# Patient Record
Sex: Male | Born: 1940 | Race: Black or African American | Hispanic: No | Marital: Married | Smoking: Never smoker
Health system: Southern US, Community
[De-identification: ages and names within clinical notes are randomized; demographics above are authoritative.]

## PROBLEM LIST (undated history)

## (undated) DIAGNOSIS — I10 Essential (primary) hypertension: Secondary | ICD-10-CM

## (undated) DIAGNOSIS — E119 Type 2 diabetes mellitus without complications: Secondary | ICD-10-CM

---

## 2014-06-14 ENCOUNTER — Inpatient Hospital Stay (HOSPITAL_BASED_OUTPATIENT_CLINIC_OR_DEPARTMENT_OTHER)
Admission: EM | Admit: 2014-06-14 | Discharge: 2014-06-16 | DRG: 378 | Disposition: A | Discharge status: Home / Self Care | Payer: Medicare HMO | Attending: Internal Medicine | Admitting: Internal Medicine

## 2014-06-14 ENCOUNTER — Encounter (HOSPITAL_BASED_OUTPATIENT_CLINIC_OR_DEPARTMENT_OTHER): Payer: Self-pay

## 2014-06-14 ENCOUNTER — Other Ambulatory Visit (HOSPITAL_BASED_OUTPATIENT_CLINIC_OR_DEPARTMENT_OTHER): Payer: Self-pay

## 2014-06-14 ENCOUNTER — Inpatient Hospital Stay (HOSPITAL_COMMUNITY): Payer: Medicare HMO

## 2014-06-14 DIAGNOSIS — K922 Gastrointestinal hemorrhage, unspecified: Secondary | ICD-10-CM | POA: Diagnosis not present

## 2014-06-14 DIAGNOSIS — Z86010 Personal history of colonic polyps: Secondary | ICD-10-CM

## 2014-06-14 DIAGNOSIS — K921 Melena: Secondary | ICD-10-CM | POA: Diagnosis not present

## 2014-06-14 DIAGNOSIS — R634 Abnormal weight loss: Secondary | ICD-10-CM | POA: Diagnosis present

## 2014-06-14 DIAGNOSIS — I5040 Unspecified combined systolic (congestive) and diastolic (congestive) heart failure: Secondary | ICD-10-CM | POA: Diagnosis present

## 2014-06-14 DIAGNOSIS — I441 Atrioventricular block, second degree: Secondary | ICD-10-CM | POA: Diagnosis present

## 2014-06-14 DIAGNOSIS — K5733 Diverticulitis of large intestine without perforation or abscess with bleeding: Secondary | ICD-10-CM | POA: Diagnosis present

## 2014-06-14 DIAGNOSIS — K625 Hemorrhage of anus and rectum: Secondary | ICD-10-CM | POA: Diagnosis present

## 2014-06-14 DIAGNOSIS — Z79899 Other long term (current) drug therapy: Secondary | ICD-10-CM

## 2014-06-14 DIAGNOSIS — I10 Essential (primary) hypertension: Secondary | ICD-10-CM | POA: Diagnosis present

## 2014-06-14 DIAGNOSIS — Z791 Long term (current) use of non-steroidal anti-inflammatories (NSAID): Secondary | ICD-10-CM

## 2014-06-14 DIAGNOSIS — Z7982 Long term (current) use of aspirin: Secondary | ICD-10-CM

## 2014-06-14 DIAGNOSIS — E119 Type 2 diabetes mellitus without complications: Secondary | ICD-10-CM | POA: Diagnosis present

## 2014-06-14 HISTORY — DX: Essential (primary) hypertension: I10

## 2014-06-14 HISTORY — DX: Type 2 diabetes mellitus without complications: E11.9

## 2014-06-14 LAB — PROTIME-INR
INR: 1.06 (ref 0.00–1.49)
PROTHROMBIN TIME: 13.8 s (ref 11.6–15.2)

## 2014-06-14 LAB — COMPREHENSIVE METABOLIC PANEL
ALBUMIN: 3.3 g/dL — AB (ref 3.5–5.0)
ALK PHOS: 35 U/L — AB (ref 38–126)
ALT: 10 U/L — AB (ref 17–63)
ANION GAP: 6 (ref 5–15)
AST: 24 U/L (ref 15–41)
BILIRUBIN TOTAL: 0.2 mg/dL — AB (ref 0.3–1.2)
BUN: 16 mg/dL (ref 6–20)
CHLORIDE: 109 mmol/L (ref 101–111)
CO2: 26 mmol/L (ref 22–32)
Calcium: 8.6 mg/dL — ABNORMAL LOW (ref 8.9–10.3)
Creatinine, Ser: 1.04 mg/dL (ref 0.61–1.24)
GFR calc Af Amer: >60 mL/min (ref >60)
GFR calc non Af Amer: >60 mL/min (ref >60)
Glucose, Bld: 129 mg/dL — ABNORMAL HIGH (ref 70–99)
Potassium: 4.2 mmol/L (ref 3.5–5.1)
Sodium: 141 mmol/L (ref 135–145)
Total Protein: 7.6 g/dL (ref 6.5–8.1)

## 2014-06-14 LAB — OCCULT BLOOD X 1 CARD TO LAB, STOOL: FECAL OCCULT BLD: POSITIVE — AB

## 2014-06-14 LAB — GLUCOSE, CAPILLARY
GLUCOSE-CAPILLARY: 91 mg/dL (ref 70–99)
Glucose-Capillary: 150 mg/dL — ABNORMAL HIGH (ref 70–99)
Glucose-Capillary: 157 mg/dL — ABNORMAL HIGH (ref 70–99)

## 2014-06-14 LAB — TYPE AND SCREEN
ABO/RH(D): B POS
ANTIBODY SCREEN: NEGATIVE

## 2014-06-14 LAB — CBC
HEMATOCRIT: 32.5 % — AB (ref 39.0–52.0)
Hemoglobin: 10.5 g/dL — ABNORMAL LOW (ref 13.0–17.0)
MCH: 22.7 pg — AB (ref 26.0–34.0)
MCHC: 32.3 g/dL (ref 30.0–36.0)
MCV: 70.3 fL — AB (ref 78.0–100.0)
PLATELETS: 201 10*3/uL (ref 150–400)
RBC: 4.62 MIL/uL (ref 4.22–5.81)
RDW: 18 % — AB (ref 11.5–15.5)
WBC: 4.2 10*3/uL (ref 4.0–10.5)

## 2014-06-14 LAB — MAGNESIUM: Magnesium: 1.9 mg/dL (ref 1.7–2.4)

## 2014-06-14 LAB — HEMOGLOBIN AND HEMATOCRIT, BLOOD
HCT: 32.8 % — ABNORMAL LOW (ref 39.0–52.0)
HCT: 33.7 % — ABNORMAL LOW (ref 39.0–52.0)
Hemoglobin: 10.4 g/dL — ABNORMAL LOW (ref 13.0–17.0)
Hemoglobin: 10.4 g/dL — ABNORMAL LOW (ref 13.0–17.0)

## 2014-06-14 LAB — TROPONIN I
TROPONIN I: 0.05 ng/mL — AB (ref <0.031)
Troponin I: 0.06 ng/mL — ABNORMAL HIGH (ref <0.031)

## 2014-06-14 LAB — APTT: aPTT: 26 seconds (ref 24–37)

## 2014-06-14 LAB — ABO/RH: ABO/RH(D): B POS

## 2014-06-14 MED ORDER — ONDANSETRON HCL 4 MG PO TABS: 4.0000 mg | ORAL_TABLET | Freq: Four times a day (QID) | ORAL | Status: DC | PRN

## 2014-06-14 MED ORDER — ONDANSETRON HCL 4 MG/2ML IJ SOLN: 4.0000 mg | Freq: Four times a day (QID) | INTRAMUSCULAR | Status: DC | PRN

## 2014-06-14 MED ORDER — POLYETHYLENE GLYCOL 3350 17 G PO PACK
17.0000 g | PACK | Freq: Three times a day (TID) | ORAL | Status: DC
  Administered 2014-06-14 – 2014-06-15 (×5): 17 g via ORAL
  Filled 2014-06-14 (×5): qty 1

## 2014-06-14 MED ORDER — PANTOPRAZOLE SODIUM 40 MG IV SOLR
40.0000 mg | Freq: Two times a day (BID) | INTRAVENOUS | Status: DC
  Administered 2014-06-14 – 2014-06-15 (×4): 40 mg via INTRAVENOUS
  Filled 2014-06-14 (×4): qty 40

## 2014-06-14 MED ORDER — SODIUM CHLORIDE 0.9 % IJ SOLN
3.0000 mL | Freq: Two times a day (BID) | INTRAMUSCULAR | Status: DC
  Administered 2014-06-15: 3 mL via INTRAVENOUS

## 2014-06-14 MED ORDER — GUAIFENESIN-DM 100-10 MG/5ML PO SYRP: 5.0000 mL | ORAL_SOLUTION | ORAL | Status: DC | PRN

## 2014-06-14 MED ORDER — CETYLPYRIDINIUM CHLORIDE 0.05 % MT LIQD
7.0000 mL | Freq: Two times a day (BID) | OROMUCOSAL | Status: DC
  Administered 2014-06-14 – 2014-06-15 (×4): 7 mL via OROMUCOSAL

## 2014-06-14 MED ORDER — SODIUM CHLORIDE 0.9 % IV SOLN
INTRAVENOUS | Status: AC
  Administered 2014-06-14: 11:00:00 via INTRAVENOUS

## 2014-06-14 MED ORDER — HYDRALAZINE HCL 20 MG/ML IJ SOLN: 10.0000 mg | Freq: Four times a day (QID) | INTRAMUSCULAR | Status: DC | PRN

## 2014-06-14 MED ORDER — IOHEXOL 300 MG/ML  SOLN
100.0000 mL | Freq: Once | INTRAMUSCULAR | Status: AC | PRN
  Administered 2014-06-14: 100 mL via INTRAVENOUS

## 2014-06-14 MED ORDER — IOHEXOL 300 MG/ML  SOLN
25.0000 mL | INTRAMUSCULAR | Status: AC
  Administered 2014-06-14 (×2): 25 mL via ORAL

## 2014-06-14 MED ORDER — HYDROCODONE-ACETAMINOPHEN 5-325 MG PO TABS: 1.0000 | ORAL_TABLET | ORAL | Status: DC | PRN

## 2014-06-14 MED ORDER — INSULIN ASPART 100 UNIT/ML ~~LOC~~ SOLN
0.0000 [IU] | SUBCUTANEOUS | Status: DC
  Administered 2014-06-14: 1 [IU] via SUBCUTANEOUS
  Administered 2014-06-14: 2 [IU] via SUBCUTANEOUS
  Administered 2014-06-15 – 2014-06-16 (×3): 1 [IU] via SUBCUTANEOUS

## 2014-06-14 MED ORDER — METOPROLOL TARTRATE 50 MG PO TABS
50.0000 mg | ORAL_TABLET | Freq: Two times a day (BID) | ORAL | Status: DC
  Administered 2014-06-14: 50 mg via ORAL
  Filled 2014-06-14: qty 1

## 2014-06-14 NOTE — H&P (Signed)
Patient Demographics  Joel MastJoseph Higgins, is a 74 y.o. male  MRN: [id number]   DOB - [birth year]  Admit Date - 06/14/2014  Outpatient Primary MD for the patient is No primary care provider on file.   With History of -  Past Medical History  Diagnosis Date  . Hypertension   . Diabetes mellitus without complication       History reviewed. No pertinent past surgical history.  in for   Chief Complaint  Patient presents with  . Rectal Bleeding     HPI  Joel Higgins  is a 74 y.o. male, who lives in [place] and is visiting family locally with history of essential hypertension, type 2 diabetes myelitis, diverticulosis diagnosed few years ago and colonoscopy, colonic polyp removed 5 years ago in [place]. Who was in good state of health however since yesterday evening has been having bright red blood per stool, this has happened about 5-6 times, he denies any abdominal pain or discomfort, no cramping, no nausea vomiting. He does have unintentional 8 pound weight loss in the last 2 months.  He presented to [facility] where he was diagnosed with lower GI bleed, Dr. Randa EvensEdwards [facility] was consulted who requested me to admit the patient. Patient currently is completely symptom free except for blood in stool. Denies any headache, no chest pain palpitations cough and shortness of breath. No abdominal pain or cramping. No focal weakness. No blood in urine no easy bruising.   Review of Systems    In addition to the HPI above,   No Fever-chills, No Headache, No changes with Vision or hearing, No problems swallowing food or Liquids, No Chest pain, Cough or Shortness of Breath, No Abdominal pain, No Nausea or Vommitting, Bowel movements are bloody and lose No Blood in Urine, No dysuria, No new skin rashes or bruises, No new  joints pains-aches,  No new weakness, tingling, numbness in any extremity, No recent weight gain or loss, No polyuria, polydypsia or polyphagia, No significant Mental Stressors.  A full 10 point Review of Systems was done, except as stated above, all other Review of Systems were negative.   Social History History  Substance Use Topics  . Smoking status: Never Smoker   . Smokeless tobacco: Not on file  . Alcohol Use: No    Lives - [place] with wife  Mobility - ablates by self, dries is active     Family History No family history of colon cancer  Prior to Admission medications   Medication Sig Start Date End Date Taking? Authorizing Provider  aspirin 81 MG tablet Take 81 mg by mouth daily.   Yes Historical Provider, MD  ibuprofen (ADVIL,MOTRIN) 200 MG tablet Take 800 mg by mouth every 4 (four) hours as needed for headache or moderate pain.   Yes Historical Provider, MD  metFORMIN (GLUCOPHAGE) 500 MG tablet Take 2 tablets by mouth 2 (two) times daily. 05/29/14  Yes  Historical Provider, MD  metoprolol succinate (TOPROL-XL) 25 MG 24 hr tablet Take 25 mg by mouth daily. 05/29/14  Yes Historical Provider, MD  OVER THE COUNTER MEDICATION Apply 1 application topically daily. Dry Skin (diabetic lotion)   Yes Historical Provider, MD  ramipril (ALTACE) 10 MG capsule Take 1 capsule by mouth daily. 06/07/14  Yes Historical Provider, MD  Ranitidine HCl (ZANTAC 75 PO) Take by mouth.   Yes Historical Provider, MD  terazosin (HYTRIN) 1 MG capsule Take 1 capsule by mouth at bedtime. 05/29/14  Yes Historical Provider, MD    No Known Allergies  Physical Exam  Vitals  Blood pressure 164/88, pulse 84, temperature 98.3 F (36.8 C), temperature source Oral, resp. rate 16, height 6' (1.829 m), weight 90.719 kg (200 lb), SpO2 99 %.   1. General elderly African-American male lying in bed in NAD,    2. Normal affect and insight, Not Suicidal or Homicidal, Awake Alert, Oriented X 3.  3. No F.N  deficits, ALL C.Nerves Intact, Strength 5/5 all 4 extremities, Sensation intact all 4 extremities, Plantars down going.  4. Ears and Eyes appear Normal, Conjunctivae clear, PERRLA. Moist Oral Mucosa.  5. Supple Neck, No JVD, No cervical lymphadenopathy appriciated, No Carotid Bruits.  6. Symmetrical Chest wall movement, Good air movement bilaterally, CTAB.  7. RRR, No Gallops, Rubs or Murmurs, No Parasternal Heave.  8. Positive Bowel Sounds, Abdomen Soft, No tenderness, No organomegaly appriciated,No rebound -guarding or rigidity.  9.  No Cyanosis, Normal Skin Turgor, No Skin Rash or Bruise.  10. Good muscle tone,  joints appear normal , no effusions, Normal ROM.  11. No Palpable Lymph Nodes in Neck or Axillae     Data Review  CBC  Recent Labs Lab 06/14/14 0940  WBC 4.2  HGB 10.5*  HCT 32.5*  PLT 201  MCV 70.3*  MCH 22.7*  MCHC 32.3  RDW 18.0*   ------------------------------------------------------------------------------------------------------------------  Chemistries   Recent Labs Lab 06/14/14 0940  NA 141  K 4.2  CL 109  CO2 26  GLUCOSE 129*  BUN 16  CREATININE 1.04  CALCIUM 8.6*  MG 1.9  AST 24  ALT 10*  ALKPHOS 35*  BILITOT 0.2*   ------------------------------------------------------------------------------------------------------------------ estimated creatinine clearance is 68.4 mL/min (by C-G formula based on Cr of 1.04). ------------------------------------------------------------------------------------------------------------------ No results for input(s): TSH, T4TOTAL, T3FREE, THYROIDAB in the last 72 hours.  Invalid input(s): FREET3   Coagulation profile  Recent Labs Lab 06/14/14 0940  INR 1.06   ------------------------------------------------------------------------------------------------------------------- No results for input(s): DDIMER in the last 72  hours. -------------------------------------------------------------------------------------------------------------------  Cardiac Enzymes No results for input(s): CKMB, TROPONINI, MYOGLOBIN in the last 168 hours.  Invalid input(s): CK ------------------------------------------------------------------------------------------------------------------ Invalid input(s): POCBNP   ---------------------------------------------------------------------------------------------------------------  Urinalysis No results found for: COLORURINE, APPEARANCEUR, LABSPEC, PHURINE, GLUCOSEU, HGBUR, BILIRUBINUR, KETONESUR, PROTEINUR, UROBILINOGEN, NITRITE, LEUKOCYTESUR  ----------------------------------------------------------------------------------------------------------------  Imaging results:   No results found.  My personal review of EKG: Rhythm NSR,non specific ST changes    Assessment & Plan   1. Lower GI bleed. Suspicious for diverticular bleed, liver malignancy at this stage cannot be ruled out especially with 8 pound unintentional weight loss. Will be admitted to a telemetry bed, ice cream, IV PPI, clear liquids, monitor H&H. Goal to keep hemoglobin above 8. IV fluids. GI Dr. Randa Evens has been consulted. Will obtain CT abdomen pelvis for now.   2. Essential hypertension. For now only Lopressor and as needed hydralazine and monitor.    3. Nonspecific ST changes on EKG. No chest symptoms,  check troponin 2. Continue beta blocker for now. Keep hemoglobin above 8. No CAD history of CHF history.    4. DM type II. Check A1c, every 4 sliding scale. Hold Glucophage.     DVT Prophylaxis   SCDs   AM Labs Ordered, also please review Full Orders  Family Communication: Admission, patients condition and plan of care including tests being ordered have been discussed with the patient and wife who indicate understanding and agree with the plan and Code Status.  Code Status Full  Likely DC  to  Home  Condition Fair   Time spent in minutes : 35    Lyon Dumont K M.D on 06/14/2014 at 2:22 PM  Between 7am to 7pm - Pager - [phone]  After 7pm go to [email or web address] - password [email or web address]  [facility]  Office  [phone]

## 2014-06-14 NOTE — Progress Notes (Signed)
Patient arrived to unit at around 1340,came via non emergency ambulance. Alert and orientedx3. Denies pain.Oriented to environment. Placed comfortably in bed. Will continue to monitor.

## 2014-06-14 NOTE — ED Provider Notes (Signed)
TIME SEEN: 10:45 AM  CHIEF COMPLAINT: GI bleed  HPI: Pt is a 74 y.o. male with history of hypertension, diabetes, diverticulosis who presents to the emergency department with rectal bleeding that started yesterday. States 2 weeks ago he did have one episode of bright red blood per rectum but this resolved. States yesterday he started having blood in his stools and has noticed a large amount of blood being passed with bowel movements. Has not noticed any clots. Has had over 5 episodes since yesterday evening. Denies melena. No abdominal pain. No vomiting. Not on anticoagulation. Denies prior history of GI bleed. Last colonoscopy was approximate 5 years ago. States he was told he has diverticulosis and polyps. Reports he is feeling lightheaded, fatigued, weak. He is visiting his family from [place] where his PCP Dr. Moses MannersVincent Ober with [facility] is located.  His gastroenterologist is also in [place]. He does not plan to go back to [place] until May 16.  ROS: See HPI Constitutional: no fever  Eyes: no drainage  ENT: no runny nose   Cardiovascular:  no chest pain  Resp: no SOB  GI: no vomiting GU: no dysuria Integumentary: no rash  Allergy: no hives  Musculoskeletal: no leg swelling  Neurological: no slurred speech ROS otherwise negative  PAST MEDICAL HISTORY/PAST SURGICAL HISTORY:  Past Medical History  Diagnosis Date  . Hypertension   . Diabetes mellitus without complication     MEDICATIONS:  Prior to Admission medications   Medication Sig Start Date End Date Taking? Authorizing Provider  aspirin 81 MG tablet Take 81 mg by mouth daily.   Yes Historical Provider, MD  METFORMIN HCL ER, MOD, PO Take by mouth.   Yes Historical Provider, MD  METOPROLOL SUCCINATE ER PO Take by mouth.   Yes Historical Provider, MD  RAMIPRIL PO Take by mouth.   Yes Historical Provider, MD  Ranitidine HCl (ZANTAC 75 PO) Take by mouth.   Yes Historical Provider, MD  TERAZOSIN HCL PO Take by  mouth.   Yes Historical Provider, MD    ALLERGIES:  No Known Allergies  SOCIAL HISTORY:  History  Substance Use Topics  . Smoking status: Never Smoker   . Smokeless tobacco: Not on file  . Alcohol Use: No    FAMILY HISTORY: History reviewed. No pertinent family history.  EXAM: BP 149/111 mmHg  Pulse 28  Temp(Src) 98.2 F (36.8 C) (Oral)  Resp 18  Ht 6' (1.829 m)  Wt 200 lb (90.719 kg)  BMI 27.12 kg/m2  SpO2 98% CONSTITUTIONAL: Alert and oriented and responds appropriately to questions. Well-appearing; well-nourished HEAD: Normocephalic EYES: Conjunctivae clear, PERRL ENT: normal nose; no rhinorrhea; moist mucous membranes; pharynx without lesions noted NECK: Supple, no meningismus, no LAD  CARD: RRR; S1 and S2 appreciated; no murmurs, no clicks, no rubs, no gallops RESP: Normal chest excursion without splinting or tachypnea; breath sounds clear and equal bilaterally; no wheezes, no rhonchi, no rales, no hypoxia or respiratory distress, speaking full sentences ABD/GI: Normal bowel sounds; non-distended; soft, non-tender, no rebound, no guarding, no peritoneal signs RECTAL:  Large amount of bright red blood in the rectal vault, no melena, normal rectal tone, no hemorrhoids, nontender rectal exam BACK:  The back appears normal and is non-tender to palpation, there is no CVA tenderness EXT: Normal ROM in all joints; non-tender to palpation; no edema; normal capillary refill; no cyanosis, no calf tenderness or swelling    SKIN: Normal color for age and race; warm NEURO: Moves all extremities  equally, sensation to light touch intact diffusely, cranial nerves II through XII intact PSYCH: The patient's mood and manner are appropriate. Grooming and personal hygiene are appropriate.  MEDICAL DECISION MAKING: Patient here with rectal bleed, likely diverticular in nature. He is hemodynamically stable. Intermittently bradycardic but this quickly resolves. EKG shows first-degree AV block  which he thinks is chronic. No chest pain or shortness of breath. He does have a large amount of blood in his rectal vault today. Hemoglobin is 10.5 with no old for comparison. Will obtain to obtain outside records. Given his age and the large amount of blood seen on rectal exam without obvious sign of hemorrhoids I feel he will need admission. We'll discuss with gastroenterology on-call. [facility] would [facility].  ED PROGRESS: 11:30 AM  Spoke with [facility]anda [facility]wards [facility]. He agrees to see the patient in consult. Will discuss with hospitalist.   11:45 AM  D/w Dr. Gonzella Lexhungel for [facility].   EKG Interpretation  Date/Time:  Friday Jun 14 2014 11:04:52 EDT Ventricular Rate:  65 PR Interval:    QRS Duration: 98 QT Interval:  406 QTC Calculation: 422 R Axis:   26 Text Interpretation:  Normal sinus rhythm First degree A-V block Premature ventricular complexes Nonspecific T wave abnormality Abnormal ECG Confirmed by WARD,  DO, KRISTEN ([id number]) on 06/14/2014 11:16:10 AM          Layla MawKristen N Ward, DO 06/14/14 1712

## 2014-06-14 NOTE — ED Notes (Signed)
Pt reports one week of bloody stools - states yesterday the bright red blood in his stools became worse and greater in the amount. Pt denies feeling weak, dizzy, tired. Denies pain, reports weight loss of a few lbs over the past few months.

## 2014-06-14 NOTE — Progress Notes (Addendum)
Pt converted to 2nd degree AV block, RN did EKG and confirmed the Rhythm, RN informed oncall N.P. Ordered to call him on the 3rd tropinin lvl result. Pt is asymptomatic, no complain of chest pain, Will continue to monitor.

## 2014-06-14 NOTE — Consult Note (Signed)
[facility] CON[facility] Reason for consult: G.I. bleeding Referring Physician: [facility].  Joel Higgins is an 74 y.o. male.  HPI: he is visiting here from [place]. He tells me that he has had previous colon polyps and had colonoscopy about 5 years ago. He reports that he is due later this year for a surveillance colonoscopy. He also tells me that he had significant diverticulosis on previous colonoscopies. He has been doing well without constipation or any significant abdominal pain. Yesterday evening he began to have painless cinematic [place] and has had 5 to 6 bloody bowel movements. He has had some weight loss over the past several months but has been staying here in [place] and has been eating differently. He is not had any chronic abdominal pain. He does take ibuprofen but no other anticoagulants.  Past Medical History  Diagnosis Date  . Hypertension   . Diabetes mellitus without complication     History reviewed. No pertinent past surgical history.  History reviewed. No pertinent family history.  Social History:  reports that he has never smoked. He does not have any smokeless tobacco history on file. He reports that he does not drink alcohol. His drug history is not on file.  Allergies: No Known Allergies  Medications; Prior to Admission medications   Medication Sig Start Date End Date Taking? Authorizing Provider  aspirin 81 MG tablet Take 81 mg by mouth daily.   Yes Historical Provider, MD  ibuprofen (ADVIL,MOTRIN) 200 MG tablet Take 800 mg by mouth every 4 (four) hours as needed for headache or moderate pain.   Yes Historical Provider, MD  metFORMIN (GLUCOPHAGE) 500 MG tablet Take 2 tablets by mouth 2 (two) times daily. 05/29/14  Yes Historical Provider, MD  metoprolol succinate (TOPROL-XL) 25 MG 24 hr tablet Take 25 mg by mouth daily. 05/29/14  Yes Historical Provider, MD  OVER THE COUNTER MEDICATION Apply 1 application topically daily. Dry Skin (diabetic  lotion)   Yes Historical Provider, MD  ramipril (ALTACE) 10 MG capsule Take 1 capsule by mouth daily. 06/07/14  Yes Historical Provider, MD  Ranitidine HCl (ZANTAC 75 PO) Take by mouth.   Yes Historical Provider, MD  terazosin (HYTRIN) 1 MG capsule Take 1 capsule by mouth at bedtime. 05/29/14  Yes Historical Provider, MD   . antiseptic oral rinse  7 mL Mouth Rinse BID  . insulin aspart  0-9 Units Subcutaneous Q4H  . metoprolol tartrate  50 mg Oral BID  . pantoprazole (PROTONIX) IV  40 mg Intravenous Q12H  . sodium chloride  3 mL Intravenous Q12H   PRN Meds guaiFENesin-dextromethorphan, hydrALAZINE, HYDROcodone-acetaminophen, ondansetron **OR** ondansetron (ZOFRAN) IV Results for orders placed or performed during the hospital encounter of 06/14/14 (from the past 48 hour(s))  Occult blood card to lab, stool     Status: Abnormal   Collection Time: 06/14/14  9:29 AM  Result Value Ref Range   Fecal Occult Bld POSITIVE (A) NEGATIVE  CBC     Status: Abnormal   Collection Time: 06/14/14  9:40 AM  Result Value Ref Range   WBC 4.2 4.0 - 10.5 K/uL   RBC 4.62 4.22 - 5.81 MIL/uL   Hemoglobin 10.5 (L) 13.0 - 17.0 g/dL   HCT 32.5 (L) 39.0 - 52.0 %   MCV 70.3 (L) 78.0 - 100.0 fL   MCH 22.7 (L) 26.0 - 34.0 pg   MCHC 32.3 30.0 - 36.0 g/dL   RDW 18.0 (H) 11.5 - 15.5 %   Platelets 201 150 -  400 K/uL    Comment: SPECIMEN CHECKED FOR CLOTS PLATELET COUNT CONFIRMED BY SMEAR   Comprehensive metabolic panel     Status: Abnormal   Collection Time: 06/14/14  9:40 AM  Result Value Ref Range   Sodium 141 135 - 145 mmol/L   Potassium 4.2 3.5 - 5.1 mmol/L   Chloride 109 101 - 111 mmol/L   CO2 26 22 - 32 mmol/L   Glucose, Bld 129 (H) 70 - 99 mg/dL   BUN 16 6 - 20 mg/dL   Creatinine, Ser 1.04 0.61 - 1.24 mg/dL   Calcium 8.6 (L) 8.9 - 10.3 mg/dL   Total Protein 7.6 6.5 - 8.1 g/dL   Albumin 3.3 (L) 3.5 - 5.0 g/dL   AST 24 15 - 41 U/L   ALT 10 (L) 17 - 63 U/L   Alkaline Phosphatase 35 (L) 38 - 126 U/L    Total Bilirubin 0.2 (L) 0.3 - 1.2 mg/dL   GFR calc non Af Amer >60 >60 mL/min   GFR calc Af Amer >60 >60 mL/min    Comment: (NOTE) The eGFR has been calculated using the CKD EPI equation. This calculation has not been validated in all clinical situations. eGFR's persistently <60 mL/min signify possible Chronic Kidney Disease.    Anion gap 6 5 - 15  APTT     Status: None   Collection Time: 06/14/14  9:40 AM  Result Value Ref Range   aPTT 26 24 - 37 seconds  Protime-INR     Status: None   Collection Time: 06/14/14  9:40 AM  Result Value Ref Range   Prothrombin Time 13.8 11.6 - 15.2 seconds   INR 1.06 0.00 - 1.49  Magnesium     Status: None   Collection Time: 06/14/14  9:40 AM  Result Value Ref Range   Magnesium 1.9 1.7 - 2.4 mg/dL  Glucose, capillary     Status: None   Collection Time: 06/14/14  2:46 PM  Result Value Ref Range   Glucose-Capillary 91 70 - 99 mg/dL    No results found. ROS: Constitutional: some weight loss but reports good appetite HEENT: no headaches Cardiovascular: no chest pain or history of heart disease Respiratory: no significant lung disease or shortness of breath GI: as above GU: no history of renal stones Musculoskeletal: negative other than occasional back pain Neuro/Psychiatric: okay Endocrine/Heme: okay            Blood pressure 181/59, pulse 64, temperature 98.5 F (36.9 C), temperature source Oral, resp. rate 17, height 6' (1.829 m), weight 90.719 kg (200 lb), SpO2 100 %.  Physical exam:   General-- pleasant African-American male in no acute distress ENT-- throat normal sclera nonenteric Neck-- no lymphadenopathy Heart-- regular rate and rhythm without murmurs or gallops Lungs-- clear Abdomen-- soft and nontender Psych-- alert and oriented   Assessment: 1. Painless hematochezia. Almost certainly due to diverticular bleed  Plan: will continue on liquid diet led Miralax to try to clean out. Hopefully he will stop  bleeding. We will follow with you.   Ronte Parker JR,Zacari Stiff L 06/14/2014, 3:12 PM   Pager: [phone] If no answer or after hours call [phone]

## 2014-06-15 ENCOUNTER — Inpatient Hospital Stay (HOSPITAL_COMMUNITY): Payer: Medicare HMO

## 2014-06-15 DIAGNOSIS — I441 Atrioventricular block, second degree: Secondary | ICD-10-CM

## 2014-06-15 LAB — HEMOGLOBIN A1C
Hgb A1c MFr Bld: 6.6 % — ABNORMAL HIGH (ref 4.8–5.6)
MEAN PLASMA GLUCOSE: 143 mg/dL

## 2014-06-15 LAB — CBC
HCT: 31.6 % — ABNORMAL LOW (ref 39.0–52.0)
Hemoglobin: 10 g/dL — ABNORMAL LOW (ref 13.0–17.0)
MCH: 22.7 pg — ABNORMAL LOW (ref 26.0–34.0)
MCHC: 31.6 g/dL (ref 30.0–36.0)
MCV: 71.7 fL — ABNORMAL LOW (ref 78.0–100.0)
PLATELETS: 183 10*3/uL (ref 150–400)
RBC: 4.41 MIL/uL (ref 4.22–5.81)
RDW: 17.6 % — ABNORMAL HIGH (ref 11.5–15.5)
WBC: 4.6 10*3/uL (ref 4.0–10.5)

## 2014-06-15 LAB — GLUCOSE, CAPILLARY
GLUCOSE-CAPILLARY: 105 mg/dL — AB (ref 70–99)
GLUCOSE-CAPILLARY: 121 mg/dL — AB (ref 70–99)
Glucose-Capillary: 105 mg/dL — ABNORMAL HIGH (ref 70–99)
Glucose-Capillary: 100 mg/dL — ABNORMAL HIGH (ref 70–99)
Glucose-Capillary: 114 mg/dL — ABNORMAL HIGH (ref 70–99)
Glucose-Capillary: 131 mg/dL — ABNORMAL HIGH (ref 70–99)

## 2014-06-15 LAB — BASIC METABOLIC PANEL
Anion gap: 4 — ABNORMAL LOW (ref 5–15)
BUN: 12 mg/dL (ref 6–20)
CO2: 26 mmol/L (ref 22–32)
Calcium: 8.5 mg/dL — ABNORMAL LOW (ref 8.9–10.3)
Chloride: 107 mmol/L (ref 101–111)
Creatinine, Ser: 0.9 mg/dL (ref 0.61–1.24)
GFR calc Af Amer: >60 mL/min (ref >60)
Glucose, Bld: 107 mg/dL — ABNORMAL HIGH (ref 70–99)
Potassium: 3.7 mmol/L (ref 3.5–5.1)
Sodium: 137 mmol/L (ref 135–145)

## 2014-06-15 LAB — TROPONIN I: Troponin I: 0.05 ng/mL — ABNORMAL HIGH (ref <0.031)

## 2014-06-15 MED ORDER — AMLODIPINE BESYLATE 10 MG PO TABS
10.0000 mg | ORAL_TABLET | Freq: Every day | ORAL | Status: DC
  Administered 2014-06-15 – 2014-06-16 (×2): 10 mg via ORAL
  Filled 2014-06-15 (×2): qty 1

## 2014-06-15 MED ORDER — SODIUM CHLORIDE 0.9 % IV SOLN
3.0000 g | Freq: Four times a day (QID) | INTRAVENOUS | Status: DC
  Administered 2014-06-15 – 2014-06-16 (×5): 3 g via INTRAVENOUS
  Filled 2014-06-15 (×7): qty 3

## 2014-06-15 MED ORDER — METOPROLOL TARTRATE 25 MG PO TABS: 25.0000 mg | ORAL_TABLET | Freq: Two times a day (BID) | ORAL | Status: DC

## 2014-06-15 NOTE — Progress Notes (Signed)
  Echocardiogram 2D Echocardiogram has been performed.  Aris EvertsRix, Maymie Brunke A 06/15/2014, 11:58 AM

## 2014-06-15 NOTE — Progress Notes (Signed)
Patient Demographics  Joel Higgins, is a 74 y.o. male, DOB - 1940/11/28, XBJ:[id number]  Admit date - 06/14/2014   Admitting Physician No admitting provider for patient encounter.  Outpatient Primary MD for the patient is No primary care provider on file.  LOS - 1   Chief Complaint  Patient presents with  . Rectal Bleeding        Subjective:   Joel MastJoseph Higgins today has, No headache, No chest pain, No abdominal pain - No Nausea, No new weakness tingling or numbness, No Cough - SOB. No blood in stool  Assessment & Plan    1. Lower GI bleed. Suspicious for diverticular bleed, GI malignancy at this age cannot be ruled out especially with 8 pound unintentional weight loss. Stable, no significant drop in H&H, no further GI bleed BM this morning clear, continue with bowel rest, question of mild diverticulitis on CT scan, will add IV Unasyn continue IV PPI, clear liquids, monitor H&H. Goal to keep hemoglobin above 8. IV fluids. GI Dr. Randa EvensEdwards on board. Likely colonoscopy in a few weeks after [place]. H&H stable will likely discharge tomorrow evening.   2. Essential hypertension. Stopped Lopressor due to Mobitz type I heart block, laced on Norvasc and as needed hydralazine and monitor.    3. Nonspecific ST changes on EKG. No chest symptoms, mild troponin rise is not in ACS pattern, he is chest pain-free, EKG is nonacute, cannot give aspirin due to GI bleed cannot give beta blocker due to heart block, EKG reviewed with cardiologist Dr. Simona HuhSam McDowell. Check echogram.    4. DM type II.   every 4 sliding scale. Hold Glucophage.  Lab Results  Component Value Date   HGBA1C 6.6* 06/14/2014    CBG (last 3)   Recent Labs  06/15/14 0149 06/15/14 0530 06/15/14 0803  GLUCAP 105* 100* 114*      5. Mobitz type I heart block. Symptom-free, blood pressure stable, discontinue beta blocker and monitor. Echogram ordered.     Code Status: Full  Family Communication: Wife bedside on the day of admission  Disposition Plan: Home if stable on 06/16/2014 evening   Procedures   CT scan abdomen and pelvis showing mild diverticulitis and nonspecific findings.  Echogram ordered   Consults  GI - edwards, cardiologist Dr. Simona HuhSam McDowell consulted informally for EKG and rhythm review   Medications  Scheduled Meds: . amLODipine  10 mg Oral Daily  . ampicillin-sulbactam (UNASYN) IV  3 g Intravenous 4 times per day  . antiseptic oral rinse  7 mL Mouth Rinse BID  . insulin aspart  0-9 Units Subcutaneous Q4H  . pantoprazole (PROTONIX) IV  40 mg Intravenous Q12H  . polyethylene glycol  17 g Oral TID  . sodium chloride  3 mL Intravenous Q12H   Continuous Infusions:  PRN Meds:.guaiFENesin-dextromethorphan, hydrALAZINE, HYDROcodone-acetaminophen, ondansetron **OR** ondansetron (ZOFRAN) IV  DVT Prophylaxis   SCDs   Lab Results  Component Value Date   PLT 183 06/15/2014    Antibiotics     Anti-infectives    Start     Dose/Rate Route Frequency Ordered Stop   06/15/14 0800  Ampicillin-Sulbactam (UNASYN) 3 g in sodium chloride 0.9 % 100 mL IVPB     3 g 100  mL/hr over 60 Minutes Intravenous 4 times per day 06/15/14 0728            Objective:   Filed Vitals:   06/14/14 1422 06/14/14 1441 06/14/14 2055 06/15/14 0532  BP: 181/59 181/59 166/73 150/76  Pulse: 57 64 53 67  Temp: 98.5 F (36.9 C)  99.1 F (37.3 C) 98.8 F (37.1 C)  TempSrc: Oral  Oral Oral  Resp: 17  18 18   Height:      Weight:    90.9 kg (200 lb 6.4 oz)  SpO2: 100%  100% 100%    Wt Readings from Last 3 Encounters:  06/15/14 90.9 kg (200 lb 6.4 oz)     Intake/Output Summary (Last 24 hours) at 06/15/14 1001 Last data filed at 06/15/14 0600  Gross per 24 hour  Intake    600 ml  Output      0 ml   Net    600 ml     Physical Exam  Awake Alert, Oriented X 3, No new F.N deficits, Normal affect [place].AT,PERRAL Supple Neck,No JVD, No cervical lymphadenopathy appriciated.  Symmetrical Chest wall movement, Good air movement bilaterally, CTAB RRR,No Gallops,Rubs or new Murmurs, No Parasternal Heave +ve B.Sounds, Abd Soft, No tenderness, No organomegaly appriciated, No rebound - guarding or rigidity. No Cyanosis, Clubbing or edema, No new Rash or bruise     Data Review   Micro Results No results found for this or any previous visit (from the past 240 hour(s)).  Radiology Reports Ct Abdomen Pelvis W Contrast  06/14/2014   CLINICAL DATA:  Bright red blood in stools for the past day. The pound weight loss over the past 2 months, unintentional. Diabetes.  EXAM: CT ABDOMEN AND PELVIS WITH CONTRAST  TECHNIQUE: Multidetector CT imaging of the abdomen and pelvis was performed using the standard protocol following bolus administration of intravenous contrast.  CONTRAST:  100mL OMNIPAQUE IOHEXOL 300 MG/ML  SOLN  COMPARISON:  None.  FINDINGS: Lower chest: Cylindrical bronchiectasis in both lower lobes with mild airway thickening. Mild cardiomegaly. Standard timing for abdomen imaging resulted an arterial phase imaging, indicating reduced cardiac output.  Hepatobiliary: Unremarkable. Mildly reduced sensitivity due to the early phase of contrast.  Pancreas: Unremarkable  Spleen: Unremarkable  Adrenals/Urinary Tract: Tiny hypodense lesion of the right kidney upper pole is technically nonspecific although statistically likely to be a cyst or similar benign lesion.  Stomach/Bowel: Prominent stool throughout the colon favors constipation. Considerable diverticula in the transverse, descending, and sigmoid colon compatible with extensive colonic diverticulosis. There is mild active diverticulitis in the distal sigmoid colon with stranding in the adjacent mesentery shown on images 47 through 38 of series 602.  Appendix normal.  Vascular/Lymphatic: Aortoiliac atherosclerotic vascular disease. There are scattered left inguinal lymph nodes but these are not pathologically enlarged by size criteria.  Reproductive: Enlarged prostate gland, 6.7 cm transverse by 4.2 cm anterior -posterior.  Other: No supplemental non-categorized findings.  Musculoskeletal: Lumbar spondylosis and degenerative disc disease causing bilateral foraminal impingement at L4-5 and L5-S1 and right foraminal impingement at L3-4.  IMPRESSION: 1. Mild acute diverticulitis in the distal sigmoid colon. Extensive diverticulosis involving the transverse, descending, and sigmoid colon. 2. Please note that today's exam is not a substitute for colon cancer screening. 3. Cylindrical bronchiectasis in both lower lobes with mild airway thickening, chronic in appearance. 4. Mild cardiomegaly. Delayed contrast bolus implies reduced cardiac output. 5.  Prominent stool throughout the colon favors constipation. 6.  Aortoiliac atherosclerotic vascular disease. 7. Enlarged prostate  gland. 8. Lumbar spondylosis and degenerative disc disease, causing multilevel foraminal impingement.   Electronically Signed   By: Gaylyn Rong M.D.   On: 06/14/2014 18:00     CBC  Recent Labs Lab 06/14/14 0940 06/14/14 1451 06/14/14 1948 06/15/14 0215  WBC 4.2  --   --  4.6  HGB 10.5* 10.4* 10.4* 10.0*  HCT 32.5* 32.8* 33.7* 31.6*  PLT 201  --   --  183  MCV 70.3*  --   --  71.7*  MCH 22.7*  --   --  22.7*  MCHC 32.3  --   --  31.6  RDW 18.0*  --   --  17.6*    Chemistries   Recent Labs Lab 06/14/14 0940 06/15/14 0215  NA 141 137  K 4.2 3.7  CL 109 107  CO2 26 26  GLUCOSE 129* 107*  BUN 16 12  CREATININE 1.04 0.90  CALCIUM 8.6* 8.5*  MG 1.9  --   AST 24  --   ALT 10*  --   ALKPHOS 35*  --   BILITOT 0.2*  --    ------------------------------------------------------------------------------------------------------------------ estimated creatinine  clearance is 79 mL/min (by C-G formula based on Cr of 0.9). ------------------------------------------------------------------------------------------------------------------  Recent Labs  06/14/14 1451  HGBA1C 6.6*   ------------------------------------------------------------------------------------------------------------------ No results for input(s): CHOL, HDL, LDLCALC, TRIG, CHOLHDL, LDLDIRECT in the last 72 hours. ------------------------------------------------------------------------------------------------------------------ No results for input(s): TSH, T4TOTAL, T3FREE, THYROIDAB in the last 72 hours.  Invalid input(s): FREET3 ------------------------------------------------------------------------------------------------------------------ No results for input(s): VITAMINB12, FOLATE, FERRITIN, TIBC, IRON, RETICCTPCT in the last 72 hours.  Coagulation profile  Recent Labs Lab 06/14/14 0940  INR 1.06    No results for input(s): DDIMER in the last 72 hours.  Cardiac Enzymes  Recent Labs Lab 06/14/14 1451 06/14/14 1948 06/15/14 0215  TROPONINI 0.05* 0.06* 0.05*   ------------------------------------------------------------------------------------------------------------------ Invalid input(s): POCBNP     Time Spent in minutes  35   Pretty Weltman K M.D on 06/15/2014 at 10:01 AM  Between 7am to 7pm - Pager - [phone]  After 7pm go to [email or web address] - password [facility]  [facility]   Office  [phone]

## 2014-06-15 NOTE — Progress Notes (Signed)
ANTIBIOTIC CONSULT NOTE - INITIAL  Pharmacy Consult for Unasyn Indication: Intra-abdominal infection  No Known Allergies  Patient Measurements: Height: 6' (182.9 cm) Weight: 200 lb 6.4 oz (90.9 kg) IBW/kg (Calculated) : 77.6  Vital Signs: Temp: 98.8 F (37.1 C) (05/07 0532) Temp Source: Oral (05/07 0532) BP: 150/76 mmHg (05/07 0532) Pulse Rate: 67 (05/07 0532) Intake/Output from previous day: 05/06 0701 - 05/07 0700 In: 600 [P.O.:600] Out: -  Intake/Output from this shift:    Labs:  Recent Labs  06/14/14 0940 06/14/14 1451 06/14/14 1948 06/15/14 0215  WBC 4.2  --   --  4.6  HGB 10.5* 10.4* 10.4* 10.0*  PLT 201  --   --  183  CREATININE 1.04  --   --  0.90   Estimated Creatinine Clearance: 79 mL/min (by C-G formula based on Cr of 0.9). No results for input(s): VANCOTROUGH, VANCOPEAK, VANCORANDOM, GENTTROUGH, GENTPEAK, GENTRANDOM, TOBRATROUGH, TOBRAPEAK, TOBRARND, AMIKACINPEAK, AMIKACINTROU, AMIKACIN in the last 72 hours.   Microbiology: No results found for this or any previous visit (from the past 720 hour(s)).  Medical History: Past Medical History  Diagnosis Date  . Hypertension   . Diabetes mellitus without complication     Assessment: [age] [place] PMH of essential HTN, DM type II, diverticulosis who presents with lower GI bleed. CT of abdomen and pelvis shows mild acute diverticulitis in the distal sigmoid colon and extensive diverticulosis involving the transverse, descending, and sigmoid colon. Pharmacy consulted to dose Unasyn for intra-abdominal infection.  5/7 >> Unasyn >>  No microbiology/cultures ordered  Goal of Therapy:  Appropriate antibiotic dosing for renal function and indication Eradication of infection  Plan:   Unasyn 3g IV q6h.  Follow renal function, cultures, clinical course.   Greer PickerelJigna Kamaal Cast, PharmD, BCPS Pager: [phone] 06/15/2014 7:39 AM

## 2014-06-15 NOTE — Progress Notes (Signed)
Doing well. The patient indicates that his stools are becoming more normal in color. No evident active bleeding. Feels well. Hemoglobin essentially stable over the past 24 hours.  Impression: Quiescent GI bleeding, probably of diverticular origin.  Plan:  Agree with plan for discharge tomorrow if he remains stable, especially since the patient plans to be staying in this area for the next week. However, I have advised him of the fact that, if this is diverticular bleeding, it would not be unusual for him to restart bleeding several days after it had stopped, so we are not totally out of the woods yet. Assuming he does not have further bleeding in the near future, the plan is for him to be seen by his local [place] gastro[place] after [place] returns there in the near future.  I will sign off at this time, but please let me know if we can be of further assistance in this patient's care.  Florencia Reasonsobert V. Abdikadir Fohl, M.D. Pager [phone] If no answer or after 5 PM call [phone]

## 2014-06-16 DIAGNOSIS — K921 Melena: Secondary | ICD-10-CM | POA: Insufficient documentation

## 2014-06-16 LAB — GLUCOSE, CAPILLARY
GLUCOSE-CAPILLARY: 104 mg/dL — AB (ref 70–99)
Glucose-Capillary: 146 mg/dL — ABNORMAL HIGH (ref 70–99)

## 2014-06-16 LAB — HEMOGLOBIN AND HEMATOCRIT, BLOOD
HCT: 30.6 % — ABNORMAL LOW (ref 39.0–52.0)
HEMOGLOBIN: 9.5 g/dL — AB (ref 13.0–17.0)

## 2014-06-16 MED ORDER — LISINOPRIL 10 MG PO TABS
5.0000 mg | ORAL_TABLET | Freq: Every day | ORAL | Status: DC
  Administered 2014-06-16: 5 mg via ORAL
  Filled 2014-06-16: qty 1

## 2014-06-16 MED ORDER — AMOXICILLIN-POT CLAVULANATE 875-125 MG PO TABS: 1.0000 | ORAL_TABLET | Freq: Two times a day (BID) | ORAL | Status: AC

## 2014-06-16 MED ORDER — ASPIRIN 81 MG PO TABS: 81.0000 mg | ORAL_TABLET | Freq: Every day | ORAL | Status: AC

## 2014-06-16 MED ORDER — PANTOPRAZOLE SODIUM 40 MG PO TBEC: 40.0000 mg | DELAYED_RELEASE_TABLET | Freq: Every day | ORAL | Status: AC

## 2014-06-16 MED ORDER — AMLODIPINE BESYLATE 10 MG PO TABS: 10.0000 mg | ORAL_TABLET | Freq: Every day | ORAL | Status: AC

## 2014-06-16 MED ORDER — METFORMIN HCL 500 MG PO TABS: 1000.0000 mg | ORAL_TABLET | Freq: Two times a day (BID) | ORAL | Status: AC

## 2014-06-16 NOTE — Discharge Instructions (Signed)
Follow with Primary MD in 7 days   Get CBC, CMP, 2 view Chest X ray checked  by Primary MD next visit.    Activity: As tolerated with Full fall precautions use walker/cane & assistance as needed   Disposition Home     Diet: Heart Healthy  Low Carb  Check your Weight same time everyday, if you gain over 2 pounds, or you develop in leg swelling, experience more shortness of breath or chest pain, call your Primary MD immediately. Follow Cardiac Low Salt Diet and 1.5 lit/day fluid restriction.   On your next visit with your primary care physician please Get Medicines reviewed and adjusted.   Please request your Prim.MD to go over all Hospital Tests and Procedure/Radiological results at the follow up, please get all Hospital records sent to your Prim MD by signing hospital release before you go home.   If you experience worsening of your admission symptoms, develop shortness of breath, life threatening emergency, suicidal or homicidal thoughts you must seek medical attention immediately by calling 911 or calling your MD immediately  if symptoms less severe.  You Must read complete instructions/literature along with all the possible adverse reactions/side effects for all the Medicines you take and that have been prescribed to you. Take any new Medicines after you have completely understood and accpet all the possible adverse reactions/side effects.   Do not drive, operating heavy machinery, perform activities at heights, swimming or participation in water activities or provide baby sitting services if your were admitted for syncope or siezures until you have seen by Primary MD or a Neurologist and advised to do so again.  Do not drive when taking Pain medications.    Do not take more than prescribed Pain, Sleep and Anxiety Medications  Special Instructions: If you have smoked or chewed Tobacco  in the last 2 yrs please stop smoking, stop any regular Alcohol  and or any Recreational drug  use.  Wear Seat belts while driving.   Please note  You were cared for by a hospitalist during your hospital stay. If you have any questions about your discharge medications or the care you received while you were in the hospital after you are discharged, you can call the unit and asked to speak with the hospitalist on call if the hospitalist that took care of you is not available. Once you are discharged, your primary care physician will handle any further medical issues. Please note that NO REFILLS for any discharge medications will be authorized once you are discharged, as it is imperative that you return to your primary care physician (or establish a relationship with a primary care physician if you do not have one) for your aftercare needs so that they can reassess your need for medications and monitor your lab values.

## 2014-06-16 NOTE — Discharge Summary (Addendum)
Joel Higgins, is a 74 y.o. male  DOB 06/30/1940  MRN [id number].  Admission date:  06/14/2014  Admitting Physician  No admitting provider for patient encounter.  Discharge Date:  06/16/2014   Primary MD  No primary care provider on file.  Recommendations for primary care physician for things to follow:   Monitor H&H, needs close Cardiology and GI follow up   Admission Diagnosis  Hematochezia [K92.1]   Discharge Diagnosis  Hematochezia [K92.1]    Principal Problem:   GI bleed Active Problems:   LGI bleed   Diabetes mellitus without complication   Essential hypertension, benign   Hematochezia      Past Medical History  Diagnosis Date  . Hypertension   . Diabetes mellitus without complication     History reviewed. No pertinent past surgical history.     History of present illness and  Hospital Course:     Kindly see H&P for history of present illness and admission details, please review complete Labs, Consult reports and Test reports for all details in brief  HPI  from the history and physical done on the day of admission  Joel Higgins is a 74 y.o. male, who lives in [place] and is visiting family locally with history of essential hypertension, type 2 diabetes myelitis, diverticulosis diagnosed few years ago and colonoscopy, colonic polyp removed 5 years ago in [place]. Who was in good state of health however since yesterday evening has been having bright red blood per stool, this has happened about 5-6 times, he denies any abdominal pain or discomfort, no cramping, no nausea vomiting. He does have unintentional 8 pound weight loss in the last 2 months.  He presented to [facility] where he was diagnosed with lower GI bleed, Dr. Randa [facility] was consulted who requested me to admit the patient.  Patient currently is completely symptom free except for blood in stool. Denies any headache, no chest pain palpitations cough and shortness of breath. No abdominal pain or cramping. No focal weakness. No blood in urine no easy bruising.    Hospital Course    1. Lower GI bleed. Suspicious for diverticular bleed, GI malignancy at this age cannot be ruled out especially with 8 pound unintentional weight loss. Stable, no significant drop in H&H and did not require transfusions, no further GI bleed for the last 2 days, tolerated clear liquid diet which will be advanced, question of mild diverticulitis on CT scan, will be placed on Augmentin for 5 more days received Unasyn one day IV. Symptom-free now will be discharged. Was seen by GI and cleared for discharge as well. Will require outpatient GI follow in [place] with a colonoscopy in the next few weeks.   2. Essential hypertension. Stopped Lopressor due to Mobitz type I heart block, placed on Norvasc and home dose ace.    3. Nonspecific ST changes on EKG. No chest symptoms, mild troponin rise is not in ACS pattern, he is chest pain-free, EKG is nonacute, cannot give aspirin due to GI  bleed cannot give beta blocker due to heart block, EKG reviewed with cardiologist Dr. Simona Huh. Stable nonacute echogram as below. Outpatient follow-up with primary cardiologist. Hold aspirin for 1 week due to lower GI bleed. No beta blocker due to heart block.    4. DM type II. A1c was 6.6, resume Glucophage from 06/18/2014.    5. Mobitz type I heart block. Symptom-free, blood pressure stable, discontinued beta blocker and monitor. Echogram O did without any wall motion abnormality and with chronic compensated systolic heart failure. Case was discussed in detail with cardiologist on call Dr. Harlin Heys. No urgent intervention, outpatient follow-up with his primary cardiologist in [place]. He is on ACE inhibitor. Instructions given on fluid and salt  restriction.    6. 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 Compensated, continue ACE, cannot give beta blocker due to heart block. Seated instructions on salt and fluid restriction. Outpatient follow-up with local cardiologist within 7-10 days post discharge is recommended.         Discharge Condition: Stable   Follow UP  Follow-up Information    Follow up with Your PCP and a local GI, Heart Doctor. Schedule an appointment as soon as possible for a visit in 1 week.        Discharge Instructions  and  Discharge Medications          Discharge Instructions    Discharge instructions    Complete by:  As directed   Follow with Primary MD in 7 days   Get CBC, CMP, 2 view Chest X ray checked  by Primary MD next visit.    Activity: As tolerated with Full fall precautions use walker/cane & assistance as needed   Disposition Home     Diet: Heart Healthy  Low Carb  Check your Weight same time everyday, if you gain over 2 pounds, or you develop in leg swelling, experience more shortness of breath or chest pain, call your Primary MD immediately. Follow Cardiac Low Salt Diet and 1.5 lit/day fluid restriction.   On your next visit with your primary care physician please Get Medicines reviewed and adjusted.   Please request your Prim.MD to go over all Hospital Tests and Procedure/Radiological results at the follow up, please get all Hospital records sent to your Prim MD by signing hospital release before you go home.   If you experience worsening of your admission symptoms, develop shortness of breath, life threatening emergency, suicidal or homicidal thoughts you must seek medical attention immediately by calling 911 or calling your MD immediately  if symptoms less severe.  You Must read complete instructions/literature along with all the possible adverse reactions/side effects for all the Medicines you take and that have been prescribed to  you. Take any new Medicines after you have completely understood and accpet all the possible adverse reactions/side effects.   Do not drive, operating heavy machinery, perform activities at heights, swimming or participation in water activities or provide baby sitting services if your were admitted for syncope or siezures until you have seen by Primary MD or a Neurologist and advised to do so again.  Do not drive when taking Pain medications.    Do not take more than prescribed Pain, Sleep and Anxiety Medications  Special Instructions: If you have smoked or chewed Tobacco  in the last 2 yrs please stop smoking, stop any regular Alcohol  and or any Recreational drug use.  Wear Seat belts while driving.   Please note  You were cared for by a hospitalist during your hospital stay. If you have any questions about your discharge medications or the care you received while you were in the hospital after you are discharged, you can call the unit and asked to speak with the hospitalist on call if the hospitalist that took care of you is not available. Once you are discharged, your primary care physician will handle any further medical issues. Please note that NO REFILLS for any discharge medications will be authorized once you are discharged, as it is imperative that you return to your primary care physician (or establish a relationship with a primary care physician if you do not have one) for your aftercare needs so that they can reassess your need for medications and monitor your lab values.     Increase activity slowly    Complete by:  As directed             Medication List    STOP taking these medications        ibuprofen 200 MG tablet  Commonly known as:  ADVIL,MOTRIN     metoprolol succinate 25 MG 24 hr tablet  Commonly known as:  TOPROL-XL     ZANTAC 75 PO      TAKE these medications        amLODipine 10 MG tablet  Commonly known as:  NORVASC  Take 1 tablet (10 mg total) by  mouth daily.     amoxicillin-clavulanate 875-125 MG per tablet  Commonly known as:  AUGMENTIN  Take 1 tablet by mouth 2 (two) times daily.     aspirin 81 MG tablet  Take 1 tablet (81 mg total) by mouth daily.  Start taking on:  06/23/2014     metFORMIN 500 MG tablet  Commonly known as:  GLUCOPHAGE  Take 2 tablets (1,000 mg total) by mouth 2 (two) times daily.  Start taking on:  06/18/2014     OVER THE COUNTER MEDICATION  Apply 1 application topically daily. Dry Skin (diabetic lotion)     pantoprazole 40 MG tablet  Commonly known as:  PROTONIX  Take 1 tablet (40 mg total) by mouth daily. Switch for any other PPI at similar dose and frequency     ramipril 10 MG capsule  Commonly known as:  ALTACE  Take 1 capsule by mouth daily.     terazosin 1 MG capsule  Commonly known as:  HYTRIN  Take 1 capsule by mouth at bedtime.          Diet and Activity recommendation: See Discharge Instructions above   Consults obtained -  GI - [facility], cardiologist Dr. Simona HuhSam McDowell consulted informally for EKG and rhythm review   Major procedures and Radiology Reports - PLEASE review detailed and final reports for all details, in brief -   TTE  - Left ventricle: The cavity size was normal. Wall thickness wasincreased in a pattern of moderate LVH. Systolic function wasmoderately reduced. The estimated ejection fraction was in therange of 35% to 40%. Wall motion was normal; there were noregional wall motion abnormalities. - Aortic valve: There was mild regurgitation. - Mitral valve: There was mild to moderate regurgitation. - Left atrium: The atrium was moderately dilated. - Atrial septum: There was a patent foramen ovale. There was aleft-to-right shunt. - Tricuspid valve: There was moderate regurgitation. - Pulmonic valve: There was moderate regurgitation. - Pulmonary arteries: Systolic pressure was moderately increased.PA peak pressure: 68 mm Hg (S).   Ct Abdomen Pelvis W  Contrast  06/14/2014   CLINICAL DATA:  Bright red blood in stools for the past day. The pound weight loss over the past 2 months, unintentional. Diabetes.  EXAM: CT ABDOMEN AND PELVIS WITH CONTRAST  TECHNIQUE: Multidetector CT imaging of the abdomen and pelvis was performed using the standard protocol following bolus administration of intravenous contrast.  CONTRAST:  OMNIPAQUE IOHEXOL 300 MG/ML  SOLN  COMPARISON:  None.  FINDINGS: Lower chest: Cylindrical bronchiectasis in both lower lobes with mild airway thickening. Mild cardiomegaly. Standard timing for abdomen imaging resulted an arterial phase imaging, indicating reduced cardiac output.  Hepatobiliary: Unremarkable. Mildly reduced sensitivity due to the early phase of contrast.  Pancreas: Unremarkable  Spleen: Unremarkable  Adrenals/Urinary Tract: Tiny hypodense lesion of the right kidney upper pole is technically nonspecific although statistically likely to be a cyst or similar benign lesion.  Stomach/Bowel: Prominent stool throughout the colon favors constipation. Considerable diverticula in the transverse, descending, and sigmoid colon compatible with extensive colonic diverticulosis. There is mild active diverticulitis in the distal sigmoid colon with stranding in the adjacent mesentery shown on images 47 through 38 of series 602. Appendix normal.  Vascular/Lymphatic: Aortoiliac atherosclerotic vascular disease. There are scattered left inguinal lymph nodes but these are not pathologically enlarged by size criteria.  Reproductive: Enlarged prostate gland, 6.7 cm transverse by 4.2 cm anterior -posterior.  Other: No supplemental non-categorized findings.  Musculoskeletal: Lumbar spondylosis and degenerative disc disease causing bilateral foraminal impingement at L4-5 and L5-S1 and right foraminal impingement at L3-4.  IMPRESSION: 1. Mild acute diverticulitis in the distal sigmoid colon. Extensive diverticulosis involving the transverse, descending, and  sigmoid colon. 2. Please note that today's exam is not a substitute for colon cancer screening. 3. Cylindrical bronchiectasis in both lower lobes with mild airway thickening, chronic in appearance. 4. Mild cardiomegaly. Delayed contrast bolus implies reduced cardiac output. 5.  Prominent stool throughout the colon favors constipation. 6.  Aortoiliac atherosclerotic vascular disease. 7. Enlarged prostate gland. 8. Lumbar spondylosis and degenerative disc disease, causing multilevel foraminal impingement.   Electronically Signed   By: Gaylyn Rong M.D.   On: 06/14/2014 18:00    Micro Results     No results found for this or any previous visit (from the past 240 hour(s)).     Today   Subjective:   Joel Higgins today has no headache,no chest abdominal pain,no new weakness tingling or numbness, feels much better wants to go home today.   Objective:   Blood pressure 154/77, pulse 62, temperature 99 F (37.2 C), temperature source Oral, resp. rate 16, height 6' (1.829 m), weight 87.771 kg (193 lb 8 oz), SpO2 96 %.   Intake/Output Summary (Last 24 hours) at 06/16/14 0923 Last data filed at 06/15/14 1234  Gross per 24 hour  Intake     50 ml  Output      0 ml  Net     50 ml    Exam Awake Alert, Oriented x 3, No new F.N deficits, Normal affect [place].AT,PERRAL Supple Neck,No JVD, No cervical lymphadenopathy appriciated.  Symmetrical Chest wall movement, Good air movement bilaterally, CTAB RRR,No Gallops,Rubs or new Murmurs, No Parasternal Heave +ve B.Sounds, Abd Soft, Non tender, No organomegaly appriciated, No rebound -guarding or rigidity. No Cyanosis, Clubbing or edema, No new Rash or bruise  Data Review   CBC w Diff:  Lab Results  Component Value Date   WBC 4.6 06/15/2014   HGB 9.5* 06/16/2014   HCT 30.6* 06/16/2014   PLT 183  06/15/2014    CMP:  Lab Results  Component Value Date   NA 137 06/15/2014   K 3.7 06/15/2014   CL 107 06/15/2014   CO2 26 06/15/2014   BUN 12  06/15/2014   CREATININE 0.90 06/15/2014   PROT 7.6 06/14/2014   ALBUMIN 3.3* 06/14/2014   BILITOT 0.2* 06/14/2014   ALKPHOS 35* 06/14/2014   AST 24 06/14/2014   ALT 10* 06/14/2014  .   Total Time in preparing paper work, data evaluation and todays exam - 35 minutes  Leroy Sea M.D on 06/16/2014 at 9:23 AM  [facility]   Office  [phone]

## 2014-07-13 ENCOUNTER — Other Ambulatory Visit: Payer: Self-pay | Admitting: Internal Medicine

## 2016-05-04 IMAGING — CT CT ABD-PELV W/ CM
2 of 5 series · 16 of 46 positions shown, 18 images · IV contrast (OMNIPAQUE)
Comparison: None.

CLINICAL DATA: Bright red blood in stools for the past day. The
pound weight loss over the past 2 months, unintentional. Diabetes.

EXAM:
CT ABDOMEN AND PELVIS WITH CONTRAST
TECHNIQUE: Multidetector CT imaging of the abdomen and pelvis was performed
using the standard protocol following bolus administration of
intravenous contrast.
CONTRAST:  100mL OMNIPAQUE IOHEXOL 300 MG/ML  SOLN

[Series 2: rtn a/p with · axial · 0.74mm/px · z∈[-462,-62]mm · 13 of 92 slices shown, 15 images]
[im 6/92  soft-tissue]
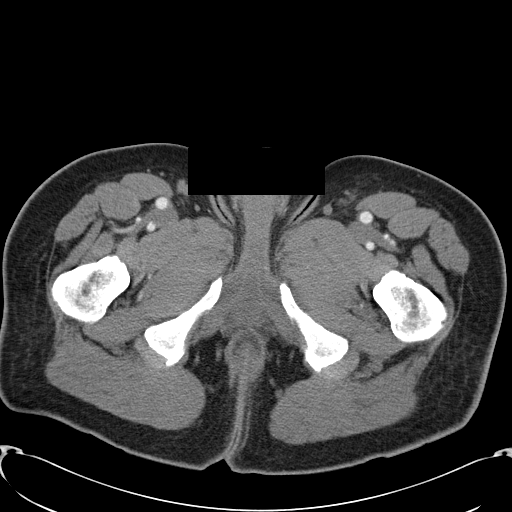
[im 6/92  bone]
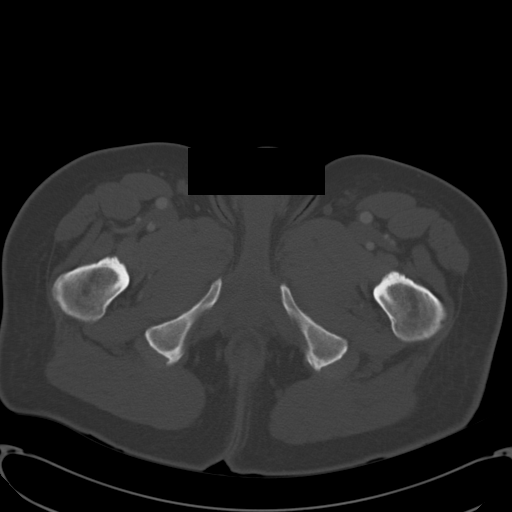
[im 11/92  soft-tissue]
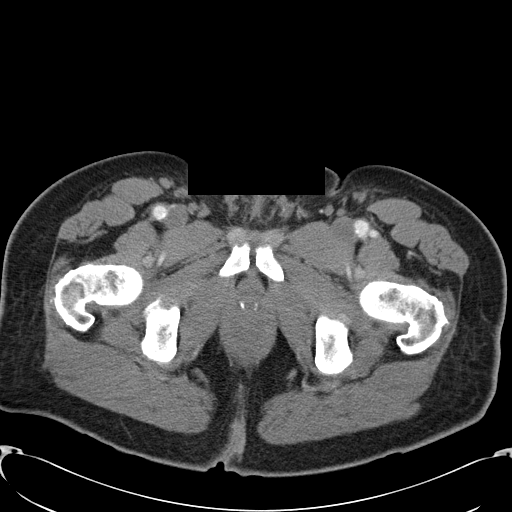
[im 22/92  soft-tissue]
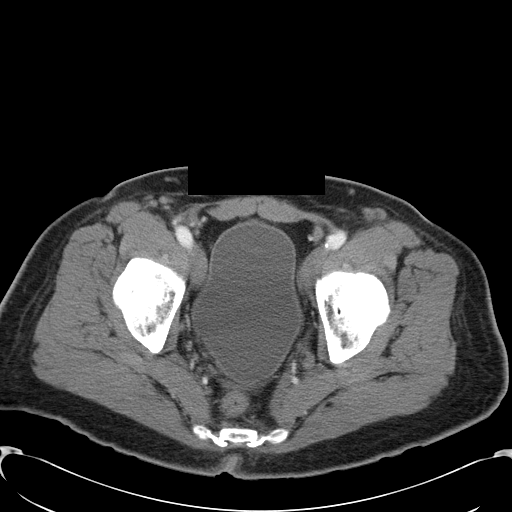
[im 27/92  soft-tissue]
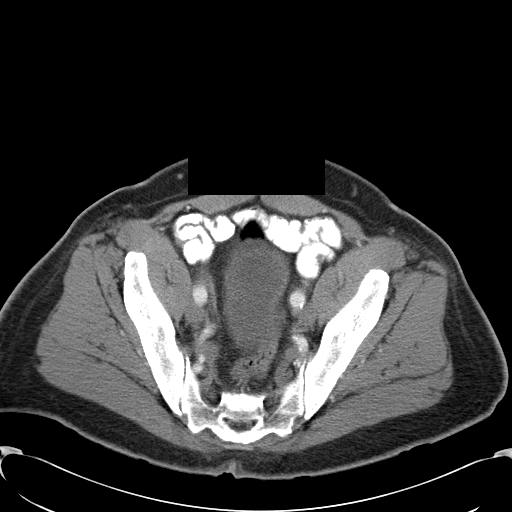
[im 33/92  soft-tissue]
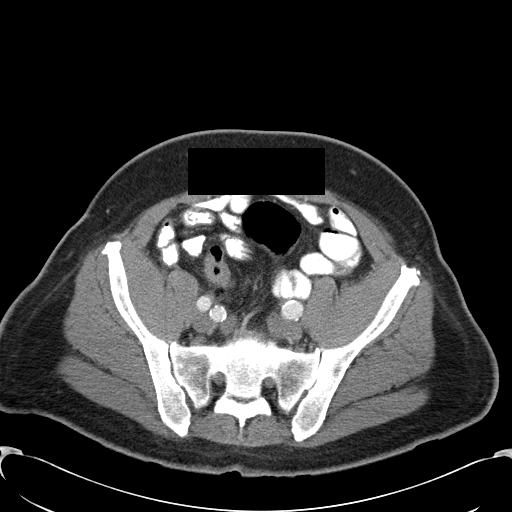
[im 38/92  soft-tissue]
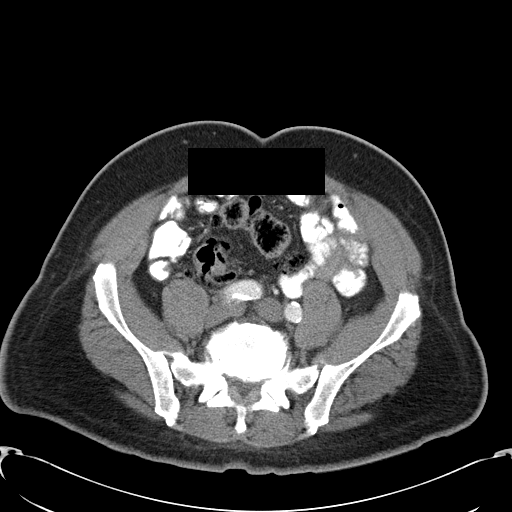
[im 49/92  soft-tissue]
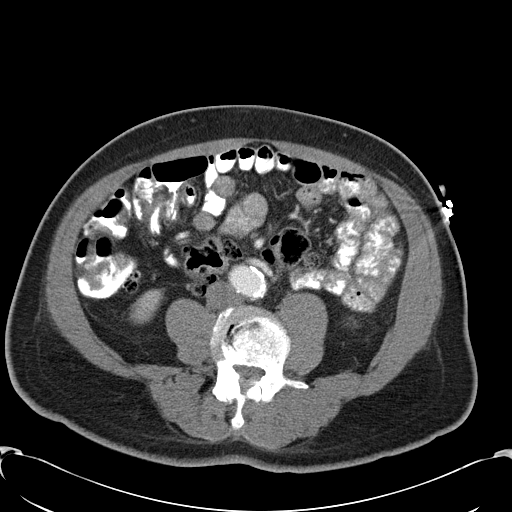
[im 54/92  soft-tissue]
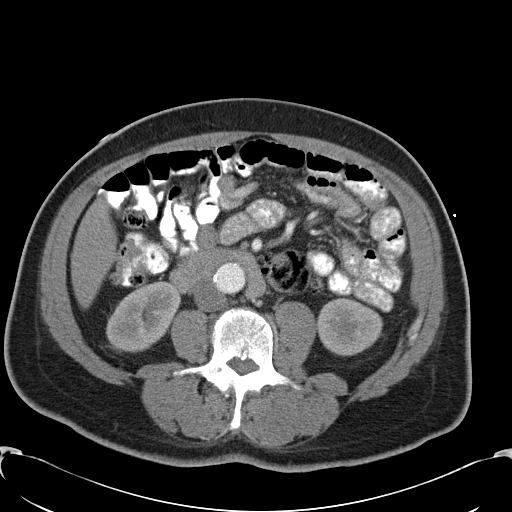
[im 59/92  soft-tissue]
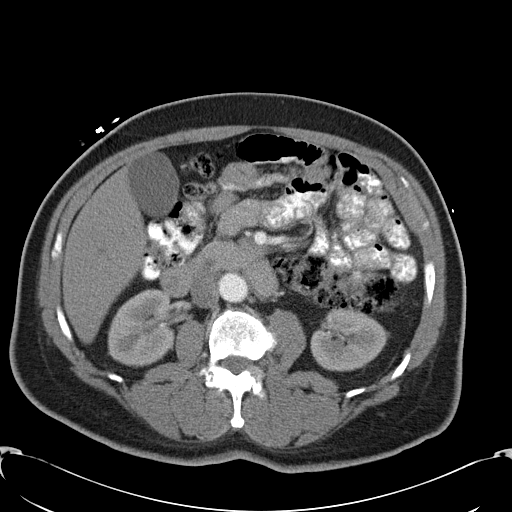
[im 59/92  bone]
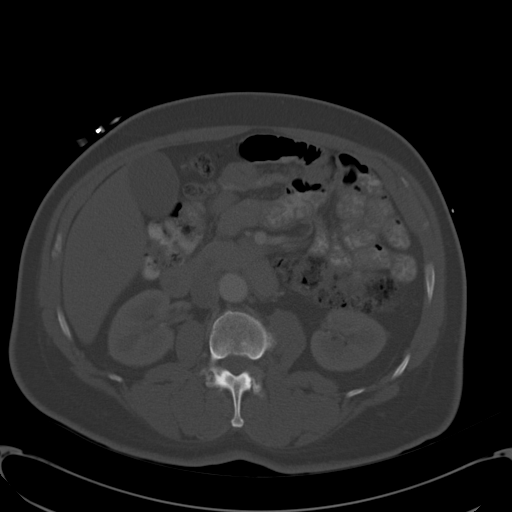
[im 65/92  soft-tissue]
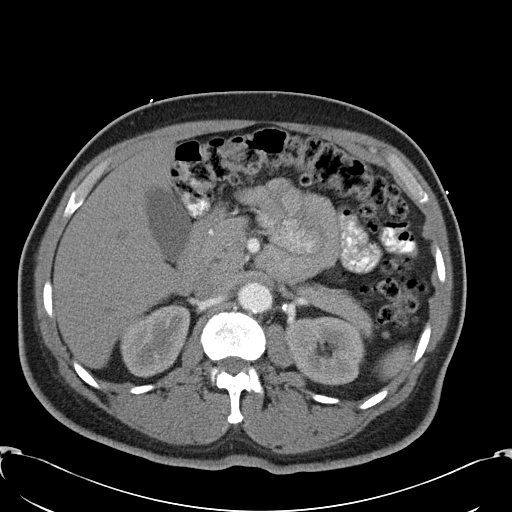
[im 70/92  soft-tissue]
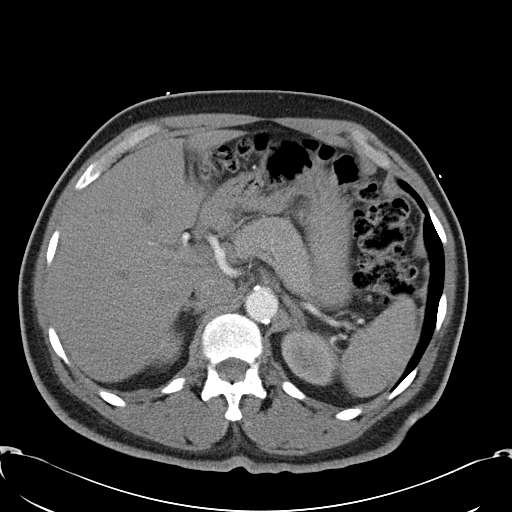
[im 81/92  soft-tissue]
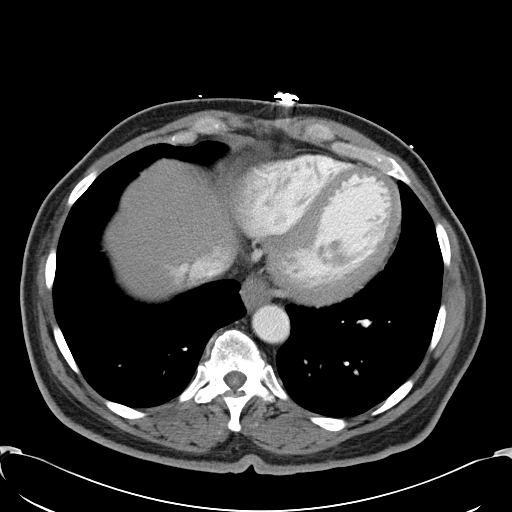
[im 86/92  soft-tissue]
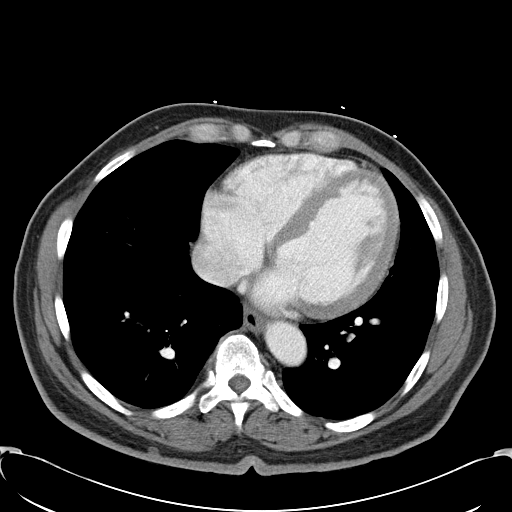

[Series 602: <mpr thick range> · coronal · 0.90mm/px · 3 of 91 slices shown]
[im 31/91  soft-tissue]
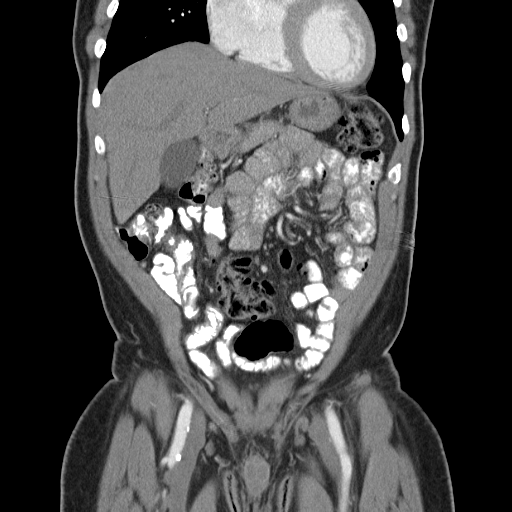
[im 41/91  soft-tissue]
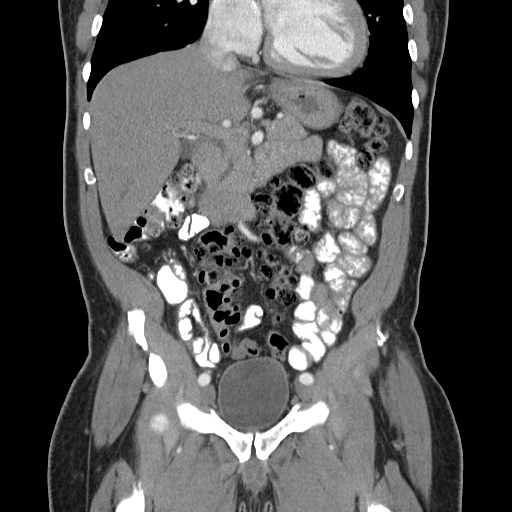
[im 51/91  soft-tissue]
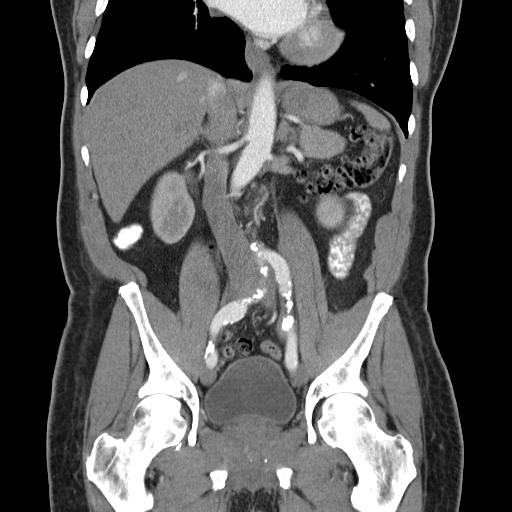

[16 of 46 positions shown; findings below may reference images not displayed]

FINDINGS: Lower chest: Cylindrical bronchiectasis in both lower lobes with
mild airway thickening. Mild cardiomegaly. Standard timing for
abdomen imaging resulted an arterial phase imaging, indicating
reduced cardiac output.

Hepatobiliary: Unremarkable. Mildly reduced sensitivity due to the
early phase of contrast.

Pancreas: Unremarkable

Spleen: Unremarkable

Adrenals/Urinary Tract: Tiny hypodense lesion of the right kidney
upper pole is technically nonspecific although statistically likely
to be a cyst or similar benign lesion.

Stomach/Bowel: Prominent stool throughout the colon favors
constipation. Considerable diverticula in the transverse,
descending, and sigmoid colon compatible with extensive colonic
diverticulosis. There is mild active diverticulitis in the distal
sigmoid colon with stranding in the adjacent mesentery shown on
images 47 through 38 of series 602. Appendix normal.

Vascular/Lymphatic: Aortoiliac atherosclerotic vascular disease.
There are scattered left inguinal lymph nodes but these are not
pathologically enlarged by size criteria.

Reproductive: Enlarged prostate gland, 6.7 cm transverse by 4.2 cm
anterior -posterior.

Other: No supplemental non-categorized findings.

Musculoskeletal: Lumbar spondylosis and degenerative disc disease
causing bilateral foraminal impingement at L4-5 and L5-S1 and right
foraminal impingement at L3-4.
IMPRESSION: 1. Mild acute diverticulitis in the distal sigmoid colon. Extensive
diverticulosis involving the transverse, descending, and sigmoid
colon.
2. Please note that today's exam is not a substitute for colon
cancer screening.
3. Cylindrical bronchiectasis in both lower lobes with mild airway
thickening, chronic in appearance.
4. Mild cardiomegaly. Delayed contrast bolus implies reduced cardiac
output.
5.  Prominent stool throughout the colon favors constipation.
6.  Aortoiliac atherosclerotic vascular disease.
7. Enlarged prostate gland.
8. Lumbar spondylosis and degenerative disc disease, causing
multilevel foraminal impingement.

## 2018-05-10 DEATH — deceased
